# Patient Record
Sex: Female | Born: 1960 | Race: Black or African American | Hispanic: No | Marital: Single | Smoking: Current every day smoker
Health system: Southern US, Community
[De-identification: ages and names within clinical notes are randomized; demographics above are authoritative.]

## PROBLEM LIST (undated history)

## (undated) DIAGNOSIS — J45909 Unspecified asthma, uncomplicated: Secondary | ICD-10-CM

## (undated) DIAGNOSIS — I10 Essential (primary) hypertension: Secondary | ICD-10-CM

## (undated) HISTORY — PX: BACK SURGERY: SHX140

---

## 2012-12-14 ENCOUNTER — Emergency Department (HOSPITAL_COMMUNITY): Payer: Self-pay

## 2012-12-14 ENCOUNTER — Emergency Department (HOSPITAL_COMMUNITY)
Admission: EM | Admit: 2012-12-14 | Discharge: 2012-12-14 | Disposition: A | Discharge status: Home / Self Care | Payer: Self-pay | Attending: Emergency Medicine | Admitting: Emergency Medicine

## 2012-12-14 ENCOUNTER — Encounter (HOSPITAL_COMMUNITY): Payer: Self-pay | Admitting: *Deleted

## 2012-12-14 DIAGNOSIS — I10 Essential (primary) hypertension: Secondary | ICD-10-CM | POA: Insufficient documentation

## 2012-12-14 DIAGNOSIS — J45909 Unspecified asthma, uncomplicated: Secondary | ICD-10-CM

## 2012-12-14 DIAGNOSIS — Z79899 Other long term (current) drug therapy: Secondary | ICD-10-CM | POA: Insufficient documentation

## 2012-12-14 DIAGNOSIS — F17200 Nicotine dependence, unspecified, uncomplicated: Secondary | ICD-10-CM | POA: Insufficient documentation

## 2012-12-14 DIAGNOSIS — J45901 Unspecified asthma with (acute) exacerbation: Secondary | ICD-10-CM | POA: Insufficient documentation

## 2012-12-14 HISTORY — DX: Unspecified asthma, uncomplicated: J45.909

## 2012-12-14 HISTORY — DX: Essential (primary) hypertension: I10

## 2012-12-14 MED ORDER — ALBUTEROL SULFATE (5 MG/ML) 0.5% IN NEBU
5.0000 mg | INHALATION_SOLUTION | Freq: Once | RESPIRATORY_TRACT | Status: AC
  Administered 2012-12-14: 5 mg via RESPIRATORY_TRACT
  Filled 2012-12-14: qty 1

## 2012-12-14 MED ORDER — PREDNISONE 50 MG PO TABS
60.0000 mg | ORAL_TABLET | Freq: Once | ORAL | Status: AC
  Administered 2012-12-14: 60 mg via ORAL
  Filled 2012-12-14: qty 1

## 2012-12-14 MED ORDER — PREDNISONE 10 MG PO TABS: 20.0000 mg | ORAL_TABLET | Freq: Every day | ORAL | Status: DC

## 2012-12-14 MED ORDER — HYDROCODONE-ACETAMINOPHEN 5-325 MG PO TABS
ORAL_TABLET | ORAL | Status: AC
  Administered 2012-12-14: 1
  Filled 2012-12-14: qty 1

## 2012-12-14 MED ORDER — IPRATROPIUM BROMIDE 0.02 % IN SOLN
0.5000 mg | Freq: Once | RESPIRATORY_TRACT | Status: AC
  Administered 2012-12-14: 0.5 mg via RESPIRATORY_TRACT
  Filled 2012-12-14: qty 2.5

## 2012-12-14 MED ORDER — ALBUTEROL SULFATE HFA 108 (90 BASE) MCG/ACT IN AERS
2.0000 | INHALATION_SPRAY | Freq: Once | RESPIRATORY_TRACT | Status: AC
  Administered 2012-12-14: 2 via RESPIRATORY_TRACT
  Filled 2012-12-14: qty 6.7

## 2012-12-14 NOTE — ED Notes (Signed)
Reports non productive cough and difficulty breathing x 2 days.  Speaking clear full sentences in triage.  nad noted.

## 2012-12-14 NOTE — ED Notes (Signed)
Pt reports feeling better after breathing treatment.  Mild wheezes heard in lower lung fields.

## 2012-12-14 NOTE — ED Provider Notes (Signed)
CSN: [id number]     Arrival date & time 12/14/12  1525 History     First MD Initiated Contact with Patient 12/14/12 1741     Chief Complaint  Patient presents with  . Shortness of Breath   (Consider location/radiation/quality/duration/timing/severity/associated sxs/prior Treatment) Patient is a 52 y.o. female presenting with shortness of breath. The history is provided by the patient (the pt complains of sob). No language interpreter was used.  Shortness of Breath Severity:  Moderate Onset quality:  Sudden Timing:  Constant Progression:  Partially resolved Chronicity:  Recurrent Context: activity   Associated symptoms: no abdominal pain, no chest pain, no cough, no headaches and no rash     Past Medical History  Diagnosis Date  . Hypertension   . Asthma    Past Surgical History  Procedure Laterality Date  . Back surgery     No family history on file. History  Substance Use Topics  . Smoking status: Current Every Day Smoker    Types: Cigarettes  . Smokeless tobacco: Not on file  . Alcohol Use: No   OB History   Grav Para Term Preterm Abortions TAB SAB Ect Mult Living                 Review of Systems  Constitutional: Negative for appetite change and fatigue.  HENT: Negative for congestion, sinus pressure and ear discharge.   Eyes: Negative for discharge.  Respiratory: Positive for shortness of breath. Negative for cough.   Cardiovascular: Negative for chest pain.  Gastrointestinal: Negative for abdominal pain and diarrhea.  Genitourinary: Negative for frequency and hematuria.  Musculoskeletal: Negative for back pain.  Skin: Negative for rash.  Neurological: Negative for seizures and headaches.  Psychiatric/Behavioral: Negative for hallucinations.    Allergies  Bactrim; Motrin; and Sulfa antibiotics  Home Medications   Current Outpatient Rx  Name  Route  Sig  Dispense  Refill  . albuterol (PROVENTIL HFA;VENTOLIN HFA) 108 (90 BASE) MCG/ACT inhaler  Inhalation   Inhale 2 puffs into the lungs every 6 (six) hours as needed for wheezing.         . diltiazem (DILACOR XR) 180 MG 24 hr capsule   Oral   Take 180 mg by mouth daily.         Marland Kitchen HYDROcodone-acetaminophen (NORCO/VICODIN) 5-325 MG per tablet   Oral   Take 1 tablet by mouth every 6 (six) hours as needed for pain.         . predniSONE (DELTASONE) 10 MG tablet   Oral   Take 2 tablets (20 mg total) by mouth daily.   6 tablet   0    BP 133/79  Pulse 88  Temp(Src) 97.9 F (36.6 C) (Oral)  Resp 20  Ht 5\' 5"  (1.651 m)  Wt 155 lb (70.308 kg)  BMI 25.79 kg/m2  SpO2 100% Physical Exam  Constitutional: She is oriented to person, place, and time. She appears well-developed.  HENT:  Head: Normocephalic.  Eyes: Conjunctivae and EOM are normal. No scleral icterus.  Neck: Neck supple. No thyromegaly present.  Cardiovascular: Normal rate and regular rhythm.  Exam reveals no gallop and no friction rub.   No murmur heard. Pulmonary/Chest: No stridor. She has wheezes. She has no rales. She exhibits no tenderness.  Abdominal: She exhibits no distension. There is no tenderness. There is no rebound.  Musculoskeletal: Normal range of motion. She exhibits no edema.  Lymphadenopathy:    She has no cervical adenopathy.  Neurological: She is oriented  to person, place, and time. Coordination normal.  Skin: No rash noted. No erythema.  Psychiatric: She has a normal mood and affect. Her behavior is normal.    ED Course   Procedures (including critical care time)  Labs Reviewed - No data to display Dg Chest 2 View  12/14/2012   *RADIOLOGY REPORT*  Clinical Data: Hypertension.  Smoker.  Shortness of breath  CHEST - 2 VIEW  Comparison: None.  Findings: The lung fields are mildly hyperexpanded with changes suggestive of underlying mild COPD.  Increased interstitial markings are noted at both lung bases Bronchitic changes are seen correlating with the history of smoking.  Bilateral apical  pleural thickening, right greater than left is noted.  No pleural fluid or focal infiltrates are seen.  No significant peribronchial cuffing is identified.  Bony structures appear maintained but overall bone density of the thoracic spine appears diminished for patient age.  IMPRESSION: Changes suggestive of mild COPD with bronchitic change.  No worrisome focal abnormality identified.  Decreased bone density of the thoracic spine suggested radiographically. This could be further assessed with DXA exam if desired   Original Report Authenticated By: Rhodia Albright, M.D.   1. Asthma     MDM  Pt improved with tx  Benny Lennert, MD 12/14/12 [phone]

## 2012-12-14 NOTE — ED Notes (Signed)
Pt reports breathing getting worse and needing breathing treatment.  Pt also reports hx of asthma which was not reported during initial triage.  sats wnl, speaking clear full sentences.  No audible wheezing heard upon assessment.  Neb treatment given per pt request.

## 2019-02-13 ENCOUNTER — Emergency Department (HOSPITAL_COMMUNITY): Payer: Medicaid Other

## 2019-02-13 ENCOUNTER — Encounter (HOSPITAL_COMMUNITY): Payer: Self-pay | Admitting: *Deleted

## 2019-02-13 ENCOUNTER — Other Ambulatory Visit: Payer: Self-pay

## 2019-02-13 ENCOUNTER — Emergency Department (HOSPITAL_COMMUNITY)
Admission: EM | Admit: 2019-02-13 | Discharge: 2019-02-13 | Disposition: A | Discharge status: Home / Self Care | Payer: Medicaid Other | Attending: Emergency Medicine | Admitting: Emergency Medicine

## 2019-02-13 DIAGNOSIS — J4541 Moderate persistent asthma with (acute) exacerbation: Secondary | ICD-10-CM | POA: Diagnosis not present

## 2019-02-13 DIAGNOSIS — I10 Essential (primary) hypertension: Secondary | ICD-10-CM | POA: Diagnosis not present

## 2019-02-13 DIAGNOSIS — J069 Acute upper respiratory infection, unspecified: Secondary | ICD-10-CM | POA: Insufficient documentation

## 2019-02-13 DIAGNOSIS — F17210 Nicotine dependence, cigarettes, uncomplicated: Secondary | ICD-10-CM | POA: Insufficient documentation

## 2019-02-13 DIAGNOSIS — R05 Cough: Secondary | ICD-10-CM | POA: Diagnosis present

## 2019-02-13 DIAGNOSIS — Z79899 Other long term (current) drug therapy: Secondary | ICD-10-CM | POA: Insufficient documentation

## 2019-02-13 DIAGNOSIS — Z20828 Contact with and (suspected) exposure to other viral communicable diseases: Secondary | ICD-10-CM | POA: Diagnosis not present

## 2019-02-13 LAB — SARS CORONAVIRUS 2 (TAT 6-24 HRS): SARS Coronavirus 2: NEGATIVE

## 2019-02-13 MED ORDER — OXYMETAZOLINE HCL 0.05 % NA SOLN
1.0000 | Freq: Once | NASAL | Status: AC
  Administered 2019-02-13: 1 via NASAL
  Filled 2019-02-13: qty 30

## 2019-02-13 MED ORDER — HYDROCOD POLST-CPM POLST ER 10-8 MG/5ML PO SUER
5.0000 mL | Freq: Once | ORAL | Status: AC
Start: 2019-02-13 — End: 2019-02-13
  Administered 2019-02-13: 5 mL via ORAL
  Filled 2019-02-13: qty 5

## 2019-02-13 MED ORDER — TRAMADOL HCL 50 MG PO TABS: 50.0000 mg | ORAL_TABLET | Freq: Four times a day (QID) | ORAL | 0 refills | Status: DC | PRN

## 2019-02-13 MED ORDER — ALBUTEROL SULFATE HFA 108 (90 BASE) MCG/ACT IN AERS
4.0000 | INHALATION_SPRAY | Freq: Once | RESPIRATORY_TRACT | Status: AC
  Administered 2019-02-13: 4 via RESPIRATORY_TRACT
  Filled 2019-02-13: qty 6.7

## 2019-02-13 MED ORDER — PREDNISONE 20 MG PO TABS: 40.0000 mg | ORAL_TABLET | Freq: Every day | ORAL | 0 refills | Status: DC

## 2019-02-13 MED ORDER — PREDNISONE 20 MG PO TABS
40.0000 mg | ORAL_TABLET | Freq: Once | ORAL | Status: AC
  Administered 2019-02-13: 40 mg via ORAL
  Filled 2019-02-13: qty 2

## 2019-02-13 MED ORDER — ONDANSETRON HCL 4 MG PO TABS
4.0000 mg | ORAL_TABLET | Freq: Once | ORAL | Status: AC
  Administered 2019-02-13: 4 mg via ORAL
  Filled 2019-02-13: qty 1

## 2019-02-13 MED ORDER — PROMETHAZINE HCL 12.5 MG PO TABS
12.5000 mg | ORAL_TABLET | Freq: Once | ORAL | Status: AC
  Administered 2019-02-13: 16:00:00 12.5 mg via ORAL
  Filled 2019-02-13: qty 1

## 2019-02-13 MED ORDER — TRAMADOL HCL 50 MG PO TABS
100.0000 mg | ORAL_TABLET | Freq: Once | ORAL | Status: AC
  Administered 2019-02-13: 100 mg via ORAL
  Filled 2019-02-13: qty 2

## 2019-02-13 MED ORDER — ALBUTEROL SULFATE (2.5 MG/3ML) 0.083% IN NEBU: 2.5000 mg | INHALATION_SOLUTION | RESPIRATORY_TRACT | 0 refills | Status: DC | PRN

## 2019-02-13 MED ORDER — ACETAMINOPHEN 500 MG PO TABS
1000.0000 mg | ORAL_TABLET | Freq: Once | ORAL | Status: AC
Start: 2019-02-13 — End: 2019-02-13
  Administered 2019-02-13: 15:00:00 1000 mg via ORAL
  Filled 2019-02-13: qty 2

## 2019-02-13 NOTE — Discharge Instructions (Addendum)
Please increase water, juices, and Gatorade's.  Good hydration is extremely important.  Use your mask, wash your hands frequently, and practice adequate distancing. Your chest x-ray is negative for pneumonia or mass or collapsed lung or other emergent changes.  Please use Afrin to each nostril 3 times daily for 5 days only.  Please use albuterol nebulizer every 4 hours as needed for wheezing or difficulty with breathing.  Use Ultram every 6 hours for rib area pain not improved by Tylenol.

## 2019-02-13 NOTE — ED Provider Notes (Signed)
[facility] EMERGENCY DEPARTMENT Provider Note   CSN: [id number] Arrival date & time: 02/13/19  1325     History   Chief Complaint Chief Complaint  Patient presents with  . Cough    HPI Caitlin Brooks is a 58 y.o. female.     Patient is a 58 year old female who presents to the emergency department with a complaint of cough and chest soreness.  Patient has a history of asthma, and hypertension.  Patient states that she uses albuterol nebulizer treatments.  She used her last nebulizer treatment this morning.  Patient states that she currently resides in [place], but is in this area visiting.  Patient states that she has been coughing over the last 2 to 3 days.  She says she has coughed until her ribs are sore and it is sore to take a deep breath.  She denies hemoptysis.  She denies any high fever.  She does not recall being around anyone with the COVID-19 virus.  She has traveled from [facility] to [place].  Nothing makes the cough any better, nothing makes it any worse.  The history is provided by the patient.  Cough Associated symptoms: myalgias   Associated symptoms: no chest pain, no ear pain, no eye discharge, no rash, no rhinorrhea, no shortness of breath and no wheezing     Past Medical History:  Diagnosis Date  . Asthma   . Hypertension     There are no active problems to display for this patient.   Past Surgical History:  Procedure Laterality Date  . BACK SURGERY       OB History   No obstetric history on file.      Home Medications    Prior to Admission medications   Medication Sig Start Date End Date Taking? Authorizing Provider  albuterol (PROVENTIL HFA;VENTOLIN HFA) 108 (90 BASE) MCG/ACT inhaler Inhale 2 puffs into the lungs every 6 (six) hours as needed for wheezing.    [provider]  diltiazem (DILACOR XR) 180 MG 24 hr capsule Take 180 mg by mouth daily.    [provider]  HYDROcodone-acetaminophen  (NORCO/VICODIN) 5-325 MG per tablet Take 1 tablet by mouth every 6 (six) hours as needed for pain.    [provider]  predniSONE (DELTASONE) 10 MG tablet Take 2 tablets (20 mg total) by mouth daily. 12/14/12   Milton Ferguson, MD    Family History No family history on file.  Social History Social History   Tobacco Use  . Smoking status: Current Every Day Smoker    Types: Cigarettes  . Smokeless tobacco: Never Used  . Tobacco comment: 4-5 cigarettes daily  Substance Use Topics  . Alcohol use: No  . Drug use: No     Allergies   Bactrim [sulfamethoxazole-trimethoprim], Motrin [ibuprofen], and Sulfa antibiotics   Review of Systems Review of Systems  Constitutional: Negative for activity change and appetite change.  HENT: Positive for congestion. Negative for ear discharge, ear pain, facial swelling, nosebleeds, rhinorrhea, sneezing and tinnitus.   Eyes: Negative for photophobia, pain and discharge.  Respiratory: Positive for cough. Negative for choking, shortness of breath and wheezing.   Cardiovascular: Negative for chest pain, palpitations and leg swelling.  Gastrointestinal: Negative for abdominal pain, blood in stool, constipation, diarrhea, nausea and vomiting.  Genitourinary: Negative for difficulty urinating, dysuria, flank pain, frequency and hematuria.  Musculoskeletal: Positive for myalgias. Negative for back pain, gait problem and neck pain.  Skin: Negative for color change, rash and  wound.  Neurological: Negative for dizziness, seizures, syncope, facial asymmetry, speech difficulty, weakness and numbness.  Hematological: Negative for adenopathy. Does not bruise/bleed easily.  Psychiatric/Behavioral: Negative for agitation, confusion, hallucinations, self-injury and suicidal ideas. The patient is not nervous/anxious.      Physical Exam Updated Vital Signs BP 121/81 (BP Location: Right Arm)   Pulse 87   Temp 97.6 F (36.4 C) (Oral)   Resp 18   Ht 5\' 5"   (1.651 m)   Wt 59 kg   SpO2 100%   BMI 21.63 kg/m   Physical Exam Vitals signs and nursing note reviewed.  Constitutional:      Appearance: She is well-developed. She is not toxic-appearing.  HENT:     Head: Normocephalic.     Right Ear: Tympanic membrane and external ear normal.     Left Ear: Tympanic membrane and external ear normal.     Nose: Congestion present.  Eyes:     General: Lids are normal.     Pupils: Pupils are equal, round, and reactive to light.  Neck:     Musculoskeletal: Normal range of motion and neck supple.     Vascular: No carotid bruit.  Cardiovascular:     Rate and Rhythm: Normal rate and regular rhythm.     Pulses: Normal pulses.     Heart sounds: Normal heart sounds.  Pulmonary:     Effort: No respiratory distress.     Breath sounds: Normal breath sounds.     Comments: Coarse breath sounds with occasional rhonchi present.  There is symmetrical rise of fall of the chest.  Patient speaks in complete sentences without problem. Abdominal:     General: Bowel sounds are normal.     Palpations: Abdomen is soft.     Tenderness: There is no abdominal tenderness. There is no guarding.  Musculoskeletal: Normal range of motion.  Lymphadenopathy:     Head:     Right side of head: No submandibular adenopathy.     Left side of head: No submandibular adenopathy.     Cervical: No cervical adenopathy.  Skin:    General: Skin is warm and dry.  Neurological:     Mental Status: She is alert and oriented to person, place, and time.     Cranial Nerves: No cranial nerve deficit.     Sensory: No sensory deficit.  Psychiatric:        Speech: Speech normal.      ED Treatments / Results  Labs (all labs ordered are listed, but only abnormal results are displayed) Labs Reviewed - No data to display  EKG None  Radiology No results found.  Procedures Procedures (including critical care time)  Medications Ordered in ED Medications - No data to display    Initial Impression / Assessment and Plan / ED Course  I have reviewed the triage vital signs and the nursing notes.  Pertinent labs & imaging results that were available during my care of the patient were reviewed by me and considered in my medical decision making (see chart for details).          Final Clinical Impressions(s) / ED Diagnoses MDM  Vital signs within normal limits.  Pulse oximetry is 100% on room air.  Within normal limits by my interpretation.  Patient has congestion and cough.  Patient has a history of asthma.  She says she has had to use her nebulizer more than usual over the last 2 to 3 days.  She says she has  coughed so much that she is now sore in her rib areas.  Patient treated in the emergency department with albuterol, Tussionex, and Tylenol.  Recheck.  Patient continues to have violent coughing.  COVID-19 test obtained. Patient treated with Afrin spray, tramadol, and promethazine.  Recheck.  Patient states the pain has improved significantly, and the coughing has improved significantly.  Patient ambulatory in the room, and in the hall with minimal problem.  Patient will be discharged home with an albuterol inhaler.  Prescription for albuterol for nebulizer.  Prescription for 12 tablets of Ultram to be used for pain not improved by Tylenol extra strength.  Patient also given a short course of a steroid medication.  The patient is unsure how long she will be in this area.  I have invited her to return to the emergency department if not improving.  I have asked her to see her physicians in [place] when she returns for recheck and evaluation.   Final diagnoses:  Moderate persistent asthma with exacerbation  Acute upper respiratory infection    ED Discharge Orders         Ordered    predniSONE (DELTASONE) 20 MG tablet  Daily,   Status:  Discontinued     02/13/19 1711    albuterol (PROVENTIL) (2.5 MG/3ML) 0.083% nebulizer solution  Every 4 hours PRN,   Status:   Discontinued     02/13/19 1711    traMADol (ULTRAM) 50 MG tablet  Every 6 hours PRN     02/13/19 1711    predniSONE (DELTASONE) 20 MG tablet  Daily     02/13/19 1717    albuterol (PROVENTIL) (2.5 MG/3ML) 0.083% nebulizer solution  Every 4 hours PRN     02/13/19 1717           Ivery QualeBryant, Chrsitopher Wik, PA-C 02/13/19 1741    Jacalyn LefevreHaviland, Julie, MD 02/14/19 [phone]

## 2019-02-13 NOTE — ED Triage Notes (Signed)
Pt c/o dry cough x 2 days. Unknown fever. Pt reports she lives in [place] and left her nebulizer machine there.

## 2019-02-15 ENCOUNTER — Telehealth: Payer: Self-pay | Admitting: General Practice

## 2019-02-15 NOTE — Telephone Encounter (Signed)
Negative COVID results given. Patient results "NOT Detected." Caller expressed understanding. ° °

## 2019-03-20 ENCOUNTER — Encounter (HOSPITAL_COMMUNITY): Payer: Self-pay

## 2019-03-20 ENCOUNTER — Other Ambulatory Visit: Payer: Self-pay

## 2019-03-20 DIAGNOSIS — R112 Nausea with vomiting, unspecified: Secondary | ICD-10-CM | POA: Diagnosis not present

## 2019-03-20 DIAGNOSIS — M545 Low back pain: Secondary | ICD-10-CM | POA: Diagnosis not present

## 2019-03-20 DIAGNOSIS — I10 Essential (primary) hypertension: Secondary | ICD-10-CM | POA: Insufficient documentation

## 2019-03-20 DIAGNOSIS — F17210 Nicotine dependence, cigarettes, uncomplicated: Secondary | ICD-10-CM | POA: Diagnosis not present

## 2019-03-20 DIAGNOSIS — J45909 Unspecified asthma, uncomplicated: Secondary | ICD-10-CM | POA: Insufficient documentation

## 2019-03-20 DIAGNOSIS — Z79899 Other long term (current) drug therapy: Secondary | ICD-10-CM | POA: Insufficient documentation

## 2019-03-20 NOTE — ED Triage Notes (Signed)
Pt states she vomited x 2 today, no other complaints.

## 2019-03-21 ENCOUNTER — Emergency Department (HOSPITAL_COMMUNITY)
Admission: EM | Admit: 2019-03-21 | Discharge: 2019-03-21 | Disposition: A | Discharge status: Home / Self Care | Payer: Medicaid Other | Attending: Emergency Medicine | Admitting: Emergency Medicine

## 2019-03-21 DIAGNOSIS — M545 Low back pain, unspecified: Secondary | ICD-10-CM

## 2019-03-21 DIAGNOSIS — R112 Nausea with vomiting, unspecified: Secondary | ICD-10-CM

## 2019-03-21 LAB — COMPREHENSIVE METABOLIC PANEL
ALT: 15 U/L (ref 0–44)
AST: 20 U/L (ref 15–41)
Albumin: 4.4 g/dL (ref 3.5–5.0)
Alkaline Phosphatase: 60 U/L (ref 38–126)
Anion gap: 10 (ref 5–15)
BUN: 35 mg/dL — ABNORMAL HIGH (ref 6–20)
CO2: 23 mmol/L (ref 22–32)
Calcium: 9.2 mg/dL (ref 8.9–10.3)
Chloride: 106 mmol/L (ref 98–111)
Creatinine, Ser: 1.2 mg/dL — ABNORMAL HIGH (ref 0.44–1.00)
GFR calc Af Amer: 57 mL/min — ABNORMAL LOW (ref >60)
GFR calc non Af Amer: 49 mL/min — ABNORMAL LOW (ref >60)
Glucose, Bld: 96 mg/dL (ref 70–99)
Potassium: 4.1 mmol/L (ref 3.5–5.1)
Sodium: 139 mmol/L (ref 135–145)
Total Bilirubin: 0.4 mg/dL (ref 0.3–1.2)
Total Protein: 7.4 g/dL (ref 6.5–8.1)

## 2019-03-21 LAB — RAPID URINE DRUG SCREEN, HOSP PERFORMED
Amphetamines: NOT DETECTED
Barbiturates: NOT DETECTED
Benzodiazepines: NOT DETECTED
Cocaine: NOT DETECTED
Opiates: POSITIVE — AB
Tetrahydrocannabinol: NOT DETECTED

## 2019-03-21 LAB — URINALYSIS, ROUTINE W REFLEX MICROSCOPIC
Bilirubin Urine: NEGATIVE
Glucose, UA: NEGATIVE mg/dL
Hgb urine dipstick: NEGATIVE
Ketones, ur: NEGATIVE mg/dL
Leukocytes,Ua: NEGATIVE
Nitrite: NEGATIVE
Protein, ur: NEGATIVE mg/dL
Specific Gravity, Urine: 1.021 (ref 1.005–1.030)
pH: 5 (ref 5.0–8.0)

## 2019-03-21 LAB — CBC WITH DIFFERENTIAL/PLATELET
Abs Immature Granulocytes: 0.02 K/uL (ref 0.00–0.07)
Basophils Absolute: 0.1 K/uL (ref 0.0–0.1)
Basophils Relative: 1 %
Eosinophils Absolute: 0.3 K/uL (ref 0.0–0.5)
Eosinophils Relative: 3 %
HCT: 40 % (ref 36.0–46.0)
Hemoglobin: 12.8 g/dL (ref 12.0–15.0)
Immature Granulocytes: 0 %
Lymphocytes Relative: 41 %
Lymphs Abs: 3.8 K/uL (ref 0.7–4.0)
MCH: 31.5 pg (ref 26.0–34.0)
MCHC: 32 g/dL (ref 30.0–36.0)
MCV: 98.5 fL (ref 80.0–100.0)
Monocytes Absolute: 0.9 K/uL (ref 0.1–1.0)
Monocytes Relative: 10 %
Neutro Abs: 4.3 K/uL (ref 1.7–7.7)
Neutrophils Relative %: 45 %
Platelets: 333 K/uL (ref 150–400)
RBC: 4.06 MIL/uL (ref 3.87–5.11)
RDW: 14 % (ref 11.5–15.5)
WBC: 9.4 K/uL (ref 4.0–10.5)
nRBC: 0 % (ref 0.0–0.2)

## 2019-03-21 LAB — LIPASE, BLOOD: Lipase: 51 U/L (ref 11–51)

## 2019-03-21 MED ORDER — SODIUM CHLORIDE 0.9 % IV BOLUS (SEPSIS)
1000.0000 mL | Freq: Once | INTRAVENOUS | Status: AC
  Administered 2019-03-21: 1000 mL via INTRAVENOUS

## 2019-03-21 MED ORDER — ONDANSETRON HCL 4 MG/2ML IJ SOLN
4.0000 mg | Freq: Once | INTRAMUSCULAR | Status: AC
  Administered 2019-03-21: 4 mg via INTRAVENOUS
  Filled 2019-03-21: qty 2

## 2019-03-21 MED ORDER — HYDROCODONE-ACETAMINOPHEN 5-325 MG PO TABS
1.0000 | ORAL_TABLET | Freq: Once | ORAL | Status: AC
  Administered 2019-03-21: 1 via ORAL
  Filled 2019-03-21: qty 1

## 2019-03-21 NOTE — ED Provider Notes (Signed)
[facility] EMERGENCY DEPARTMENT Provider Note   CSN: [id number] Arrival date & time: 03/20/19  2245     History   Chief Complaint Chief Complaint  Patient presents with  . Emesis    HPI Caitlin Brooks is a 58 y.o. female.     The history is provided by the patient.  Emesis Severity:  Moderate Progression:  Unchanged Chronicity:  New Relieved by:  Nothing Worsened by:  Nothing Associated symptoms: no abdominal pain, no diarrhea and no fever   Risk factors: no alcohol use   Patient presents with multiple complaints.  Patient has a history of asthma/hypertension.  She reports she had 2 episodes of vomiting prior to arrival.  She also reports dizziness.  She reports low blood pressure.  No diarrhea.  She also reports feeling very anxious. No new medicines.  No drug abuse.  No alcohol use Past Medical History:  Diagnosis Date  . Asthma   . Hypertension     There are no active problems to display for this patient.   Past Surgical History:  Procedure Laterality Date  . BACK SURGERY       OB History   No obstetric history on file.      Home Medications    Prior to Admission medications   Medication Sig Start Date End Date Taking? Authorizing Provider  albuterol (PROVENTIL) (2.5 MG/3ML) 0.083% nebulizer solution Take 3 mLs (2.5 mg total) by nebulization every 4 (four) hours as needed for wheezing or shortness of breath. 02/13/19   Lily Kocher, PA-C  diltiazem (DILACOR XR) 180 MG 24 hr capsule Take 180 mg by mouth daily.    [provider]  HYDROcodone-acetaminophen (NORCO/VICODIN) 5-325 MG per tablet Take 1 tablet by mouth every 6 (six) hours as needed for pain.    [provider]  predniSONE (DELTASONE) 20 MG tablet Take 2 tablets (40 mg total) by mouth daily. 02/13/19   Lily Kocher, PA-C  traMADol (ULTRAM) 50 MG tablet Take 1 tablet (50 mg total) by mouth every 6 (six) hours as needed. 02/13/19   Lily Kocher, PA-C    Family History  No family history on file.  Social History Social History   Tobacco Use  . Smoking status: Current Every Day Smoker    Types: Cigarettes  . Smokeless tobacco: Never Used  . Tobacco comment: 4-5 cigarettes daily  Substance Use Topics  . Alcohol use: No  . Drug use: No     Allergies   Bactrim [sulfamethoxazole-trimethoprim], Motrin [ibuprofen], and Sulfa antibiotics   Review of Systems Review of Systems  Constitutional: Positive for fatigue. Negative for fever.  Cardiovascular: Negative for chest pain.  Gastrointestinal: Positive for vomiting. Negative for abdominal pain and diarrhea.  Neurological: Positive for dizziness.  Psychiatric/Behavioral: The patient is nervous/anxious.   All other systems reviewed and are negative.    Physical Exam Updated Vital Signs BP 102/71 (BP Location: Right Arm)   Pulse 68   Temp 97.7 F (36.5 C) (Oral)   Resp 16   Wt 68 kg   SpO2 98%   BMI 24.96 kg/m   Physical Exam CONSTITUTIONAL: Well developed/well nourished, anxious HEAD: Normocephalic/atraumatic EYES: EOMI/PERRL, no nystagmus, no ptosis ENMT: Mucous membranes moist NECK: supple no meningeal signs  SPINE/BACK:entire spine nontender CV: S1/S2 noted, no murmurs/rubs/gallops noted LUNGS: Lungs are clear to auscultation bilaterally, no apparent distress ABDOMEN: soft, nontender, no rebound or guarding GU:no cva tenderness NEURO:Awake/alert, face symmetric, no arm or leg drift is noted Equal 5/5 strength  with shoulder abduction, elbow flex/extension, wrist flex/extension in upper extremities and equal hand grips bilaterally Cranial nerves 3/4/5/6/10/31/08/11/12 tested and intact Sensation to light touch intact in all extremities Patient appears restless on exam EXTREMITIES: pulses normal, full ROM SKIN: warm, color normal PSYCH: anxious   ED Treatments / Results  Labs (all labs ordered are listed, but only abnormal results are displayed) Labs Reviewed  COMPREHENSIVE  METABOLIC PANEL - Abnormal; Notable for the following components:      Result Value   BUN 35 (*)    Creatinine, Ser 1.20 (*)    GFR calc non Af Amer 49 (*)    GFR calc Af Amer 57 (*)    All other components within normal limits  RAPID URINE DRUG SCREEN, HOSP PERFORMED - Abnormal; Notable for the following components:   Opiates POSITIVE (*)    All other components within normal limits  CBC WITH DIFFERENTIAL/PLATELET  LIPASE, BLOOD  URINALYSIS, ROUTINE W REFLEX MICROSCOPIC    EKG EKG Interpretation  Date/Time:  Thursday March 21 2019 05:06:40 EST Ventricular Rate:  76 PR Interval:    QRS Duration: 94 QT Interval:  412 QTC Calculation: 464 R Axis:   72 Text Interpretation: Sinus rhythm Borderline T abnormalities, anterior leads Confirmed by Zadie Rhine ([id number]) on 03/21/2019 6:16:29 AM   Radiology No results found.  Procedures Procedures    Medications Ordered in ED Medications  sodium chloride 0.9 % bolus 1,000 mL (1,000 mLs Intravenous New Bag/Given 03/21/19 0511)  ondansetron (ZOFRAN) injection 4 mg (4 mg Intravenous Given 03/21/19 0511)  HYDROcodone-acetaminophen (NORCO/VICODIN) 5-325 MG per tablet 1 tablet (1 tablet Oral Given 03/21/19 0729)     Initial Impression / Assessment and Plan / ED Course  I have reviewed the triage vital signs and the nursing notes.  Pertinent labs & imaging results that were available during my care of the patient were reviewed by me and considered in my medical decision making (see chart for details).        5:25 AM Patient presents for multiple complaints including vomiting, fatigue and dizziness.  Patient appears anxious and very restless in the bed during the exam. Labs are pending at this time. 7:40 AM Patient improved.  Labs reassuring.  She ambulated well.  She reports some back pain which can be chronic for her. No new complaints. She now reports she takes Norco occasionally for back pain,  Also has  meds for  anxiety.  She reports this may explain why she feels anxious and she is twitching  Overall patient is well-appearing and appropriate for discharge home.  Patient reports she is from out of town Final Clinical Impressions(s) / ED Diagnoses   Final diagnoses:  Non-intractable vomiting with nausea, unspecified vomiting type  Acute midline low back pain without sciatica    ED Discharge Orders    None       Zadie Rhine, MD 03/21/19 [phone]

## 2019-03-21 NOTE — ED Notes (Signed)
Pt ambulated well to the bathroom by herself.

## 2019-03-21 NOTE — ED Notes (Signed)
Pt was  given warm blankets.

## 2019-05-01 ENCOUNTER — Emergency Department (HOSPITAL_COMMUNITY)
Admission: EM | Admit: 2019-05-01 | Discharge: 2019-05-01 | Disposition: A | Discharge status: Home / Self Care | Payer: Medicaid Other | Attending: Emergency Medicine | Admitting: Emergency Medicine

## 2019-05-01 ENCOUNTER — Other Ambulatory Visit: Payer: Self-pay

## 2019-05-01 ENCOUNTER — Encounter (HOSPITAL_COMMUNITY): Payer: Self-pay | Admitting: Emergency Medicine

## 2019-05-01 DIAGNOSIS — R0602 Shortness of breath: Secondary | ICD-10-CM | POA: Diagnosis present

## 2019-05-01 DIAGNOSIS — J45909 Unspecified asthma, uncomplicated: Secondary | ICD-10-CM | POA: Insufficient documentation

## 2019-05-01 DIAGNOSIS — M7918 Myalgia, other site: Secondary | ICD-10-CM | POA: Insufficient documentation

## 2019-05-01 DIAGNOSIS — Z20822 Contact with and (suspected) exposure to covid-19: Secondary | ICD-10-CM | POA: Insufficient documentation

## 2019-05-01 DIAGNOSIS — K0889 Other specified disorders of teeth and supporting structures: Secondary | ICD-10-CM | POA: Insufficient documentation

## 2019-05-01 DIAGNOSIS — B349 Viral infection, unspecified: Secondary | ICD-10-CM | POA: Diagnosis not present

## 2019-05-01 DIAGNOSIS — Z79899 Other long term (current) drug therapy: Secondary | ICD-10-CM | POA: Diagnosis not present

## 2019-05-01 DIAGNOSIS — R0789 Other chest pain: Secondary | ICD-10-CM | POA: Diagnosis not present

## 2019-05-01 DIAGNOSIS — F17210 Nicotine dependence, cigarettes, uncomplicated: Secondary | ICD-10-CM | POA: Insufficient documentation

## 2019-05-01 DIAGNOSIS — I10 Essential (primary) hypertension: Secondary | ICD-10-CM | POA: Insufficient documentation

## 2019-05-01 LAB — POC SARS CORONAVIRUS 2 AG -  ED: SARS Coronavirus 2 Ag: NEGATIVE

## 2019-05-01 MED ORDER — TRAMADOL HCL 50 MG PO TABS: 50.0000 mg | ORAL_TABLET | Freq: Four times a day (QID) | ORAL | 0 refills | Status: AC | PRN

## 2019-05-01 MED ORDER — ALBUTEROL SULFATE HFA 108 (90 BASE) MCG/ACT IN AERS
2.0000 | INHALATION_SPRAY | Freq: Once | RESPIRATORY_TRACT | Status: AC
  Administered 2019-05-01: 2 via RESPIRATORY_TRACT
  Filled 2019-05-01: qty 6.7

## 2019-05-01 MED ORDER — AMOXICILLIN 250 MG PO CAPS
500.0000 mg | ORAL_CAPSULE | Freq: Once | ORAL | Status: AC
  Administered 2019-05-01: 500 mg via ORAL
  Filled 2019-05-01: qty 2

## 2019-05-01 MED ORDER — AMOXICILLIN 500 MG PO CAPS: 500.0000 mg | ORAL_CAPSULE | Freq: Three times a day (TID) | ORAL | 0 refills | Status: AC

## 2019-05-01 MED ORDER — HYDROCODONE-ACETAMINOPHEN 5-325 MG PO TABS
1.0000 | ORAL_TABLET | Freq: Once | ORAL | Status: AC
  Administered 2019-05-01: 21:00:00 1 via ORAL
  Filled 2019-05-01: qty 1

## 2019-05-01 NOTE — Discharge Instructions (Addendum)
Your Covid test this evening is pending.  Your results should be back in 24 to 48 hours.  You may review your results on MyChart if you have this app installed on your phone.  Please quarantine at home until your results are back.  You may use the albuterol inhaler 1 to 2 puffs every 4-6 hours as needed.  Tylenol every 4 hours if needed for body aches or fever.  Take the antibiotic as directed for your dental pain and be sure to follow-up with your dentist when you return home.

## 2019-05-01 NOTE — ED Provider Notes (Signed)
[facility] EMERGENCY DEPARTMENT Provider Note   CSN: [id number] Arrival date & time: 05/01/19  1658     History Chief Complaint  Patient presents with  . Generalized Body Aches  . Shortness of Breath    ALIYA Brooks is a 59 y.o. female.  HPI      Caitlin Brooks is a 59 y.o. female with past medical history of asthma and hypertension. she presents to the Emergency Department complaining of shortness of breath, wheezing, and generalized body aches.  Symptoms began 3 days ago.  She states she is here visiting from [place] taking care of her uncle.  She denies any recent Covid exposures.  She states that she usually uses an albuterol inhaler, but did not bring her inhaler with her.  She describes feeling tightness in her chest associated with cough and wheezing.  Body aches were of sudden onset.  She denies loss of taste or smell, diarrhea, nausea or vomiting, and chest pain.  She also complains of left upper tooth pain for several days.  She is recently noticed that one of her teeth has chipped off.  She describes a sharp stabbing type pain in her upper tooth that radiates into her face and toward her ear.  Pain worse with chewing. She denies any facial swelling, neck pain, difficulty swallowing or breathing.     Past Medical History:  Diagnosis Date  . Asthma   . Hypertension     There are no problems to display for this patient.   Past Surgical History:  Procedure Laterality Date  . BACK SURGERY       OB History   No obstetric history on file.     No family history on file.  Social History   Tobacco Use  . Smoking status: Current Every Day Smoker    Types: Cigarettes  . Smokeless tobacco: Never Used  . Tobacco comment: 4-5 cigarettes daily  Substance Use Topics  . Alcohol use: No  . Drug use: No    Home Medications Prior to Admission medications   Medication Sig Start Date End Date Taking? Authorizing Provider  albuterol (PROVENTIL) (2.5 MG/3ML)  0.083% nebulizer solution Take 3 mLs (2.5 mg total) by nebulization every 4 (four) hours as needed for wheezing or shortness of breath. 02/13/19  Yes Lily Kocher, PA-C  amLODipine (NORVASC) 10 MG tablet Take 10 mg by mouth daily. 03/12/19  Yes [provider]  diltiazem (DILACOR XR) 180 MG 24 hr capsule Take 180 mg by mouth daily.   Yes [provider]  gabapentin (NEURONTIN) 300 MG capsule Take 300 mg by mouth 3 (three) times daily. 02/07/19  Yes [provider]  HYDROcodone-acetaminophen (NORCO) 7.5-325 MG tablet Take 1 tablet by mouth 3 (three) times daily. 02/07/19  Yes [provider]  lisinopril (ZESTRIL) 10 MG tablet Take 10 mg by mouth daily. 11/07/18  Yes [provider]  meloxicam (MOBIC) 15 MG tablet Take 15 mg by mouth daily. 03/12/19  Yes [provider]  PROAIR HFA 108 (90 Base) MCG/ACT inhaler Inhale 2 puffs into the lungs 2 (two) times daily.  03/12/19  Yes [provider]  sertraline (ZOLOFT) 50 MG tablet Take 50 mg by mouth daily. 03/12/19  Yes [provider]  zolpidem (AMBIEN) 5 MG tablet Take 5 mg by mouth at bedtime.  02/07/19  Yes [provider]  predniSONE (DELTASONE) 20 MG tablet Take 2 tablets (40 mg total) by mouth daily. Patient not taking: Reported on 05/01/2019  02/13/19   Ivery Quale, PA-C  traMADol (ULTRAM) 50 MG tablet Take 1 tablet (50 mg total) by mouth every 6 (six) hours as needed. Patient not taking: Reported on 05/01/2019 02/13/19   Ivery Quale, PA-C    Allergies    Bactrim [sulfamethoxazole-trimethoprim], Motrin [ibuprofen], and Sulfa antibiotics  Review of Systems   Review of Systems  Constitutional: Negative for appetite change and fever.  HENT: Positive for dental problem. Negative for congestion, facial swelling, rhinorrhea, sinus pressure, sore throat and trouble swallowing.   Eyes: Negative for pain and visual disturbance.  Respiratory: Positive for chest  tightness, shortness of breath and wheezing.   Cardiovascular: Positive for chest pain.  Gastrointestinal: Negative for abdominal pain, nausea and vomiting.  Genitourinary: Negative for dysuria and flank pain.  Musculoskeletal: Positive for myalgias. Negative for neck pain and neck stiffness.  Skin: Negative for rash and wound.  Neurological: Negative for dizziness, facial asymmetry, weakness, numbness and headaches.  Hematological: Negative for adenopathy.    Physical Exam Updated Vital Signs BP (!) 142/102 (BP Location: Right Arm)   Pulse 84   Temp 97.8 F (36.6 C) (Oral)   Resp 16   Ht 5' 5.5" (1.664 m)   Wt 70.3 kg   SpO2 99%   BMI 25.40 kg/m   Physical Exam Vitals and nursing note reviewed.  Constitutional:      General: She is not in acute distress.    Appearance: She is well-developed. She is not ill-appearing or toxic-appearing.  HENT:     Mouth/Throat:     Mouth: Mucous membranes are moist. No oral lesions.     Dentition: Dental tenderness and dental caries present. No gingival swelling or dental abscesses.     Pharynx: Oropharynx is clear. Uvula midline. No pharyngeal swelling, oropharyngeal exudate, posterior oropharyngeal erythema or uvula swelling.     Comments: ttp of the left upper first molar. Dental caries present and tooth is partially avulsed.  No edema or erythema of the surrounding gingiva.  No trismus Cardiovascular:     Rate and Rhythm: Normal rate and regular rhythm.     Pulses: Normal pulses.  Pulmonary:     Effort: Pulmonary effort is normal.     Breath sounds: Rhonchi present.  Abdominal:     Palpations: Abdomen is soft. There is no mass.     Tenderness: There is no abdominal tenderness.  Musculoskeletal:     Cervical back: Normal range of motion.     Right lower leg: No edema.     Left lower leg: No edema.  Skin:    General: Skin is warm.     Capillary Refill: Capillary refill takes less than 2 seconds.  Neurological:     General: No  focal deficit present.     Mental Status: She is alert.     Sensory: No sensory deficit.     Motor: No weakness.     ED Results / Procedures / Treatments   Labs (all labs ordered are listed, but only abnormal results are displayed) Labs Reviewed  NOVEL CORONAVIRUS, NAA (HOSP ORDER, SEND-OUT TO REF LAB; TAT 18-24 HRS)  POC SARS CORONAVIRUS 2 AG -  ED    EKG None  Radiology No results found.  Procedures Procedures (including critical care time)  Medications Ordered in ED Medications  albuterol (VENTOLIN HFA) 108 (90 Base) MCG/ACT inhaler 2 puff (has no administration in time range)  HYDROcodone-acetaminophen (NORCO/VICODIN) 5-325 MG per tablet 1 tablet (has no administration in time range)  amoxicillin (AMOXIL) capsule 500 mg (has no administration in time range)    ED Course  I have reviewed the triage vital signs and the nursing notes.  Pertinent labs & imaging results that were available during my care of the patient were reviewed by me and considered in my medical decision making (see chart for details).    MDM Rules/Calculators/A&P                      Pt here with dental pain and symptoms suggestive of COVID.  No hypoxia, tachycardia or tachypnea.  Hx of asthma, here from [place] and out of her albuterol.  She is well appearing, non-toxic. No tachycardic, hypoxia or tachypnea.  Will dispense albuterol inhaler.  She appears appropriate for d/c home.  Discussed home isolation guidelines at least until results are back, return precautions discussed.   WALLIS SPIZZIRRI was evaluated in Emergency Department on 05/03/2019 for the symptoms described in the history of present illness. She was evaluated in the context of the global COVID-19 pandemic, which necessitated consideration that the patient might be at risk for infection with the SARS-CoV-2 virus that causes COVID-19. Institutional protocols and algorithms that pertain to the evaluation of patients at risk for COVID-19  are in a state of rapid change based on information released by regulatory bodies including the CDC and federal and state organizations. These policies and algorithms were followed during the patient's care in the ED.    Final Clinical Impression(s) / ED Diagnoses Final diagnoses:  Pain, dental  Viral syndrome    Rx / DC Orders ED Discharge Orders    None       Rosey Bath 05/03/19 Windell Moment    Mancel Bale, MD 05/06/19 [phone]

## 2019-05-03 ENCOUNTER — Telehealth: Payer: Self-pay

## 2019-05-03 LAB — NOVEL CORONAVIRUS, NAA (HOSP ORDER, SEND-OUT TO REF LAB; TAT 18-24 HRS): SARS-CoV-2, NAA: NOT DETECTED

## 2019-05-03 NOTE — Telephone Encounter (Signed)
Pt notified of negative COVID-19 results. Understanding verbalized.  Chasta M Hopkins   

## 2021-03-07 IMAGING — CR DG CHEST 1V PORT
1 series · 1 of 1 positions shown · non-contrast
Comparison: 12/14/2012

CLINICAL DATA: Cough for 2 days, rib soreness.

EXAM:
PORTABLE CHEST 1 VIEW

[portable]
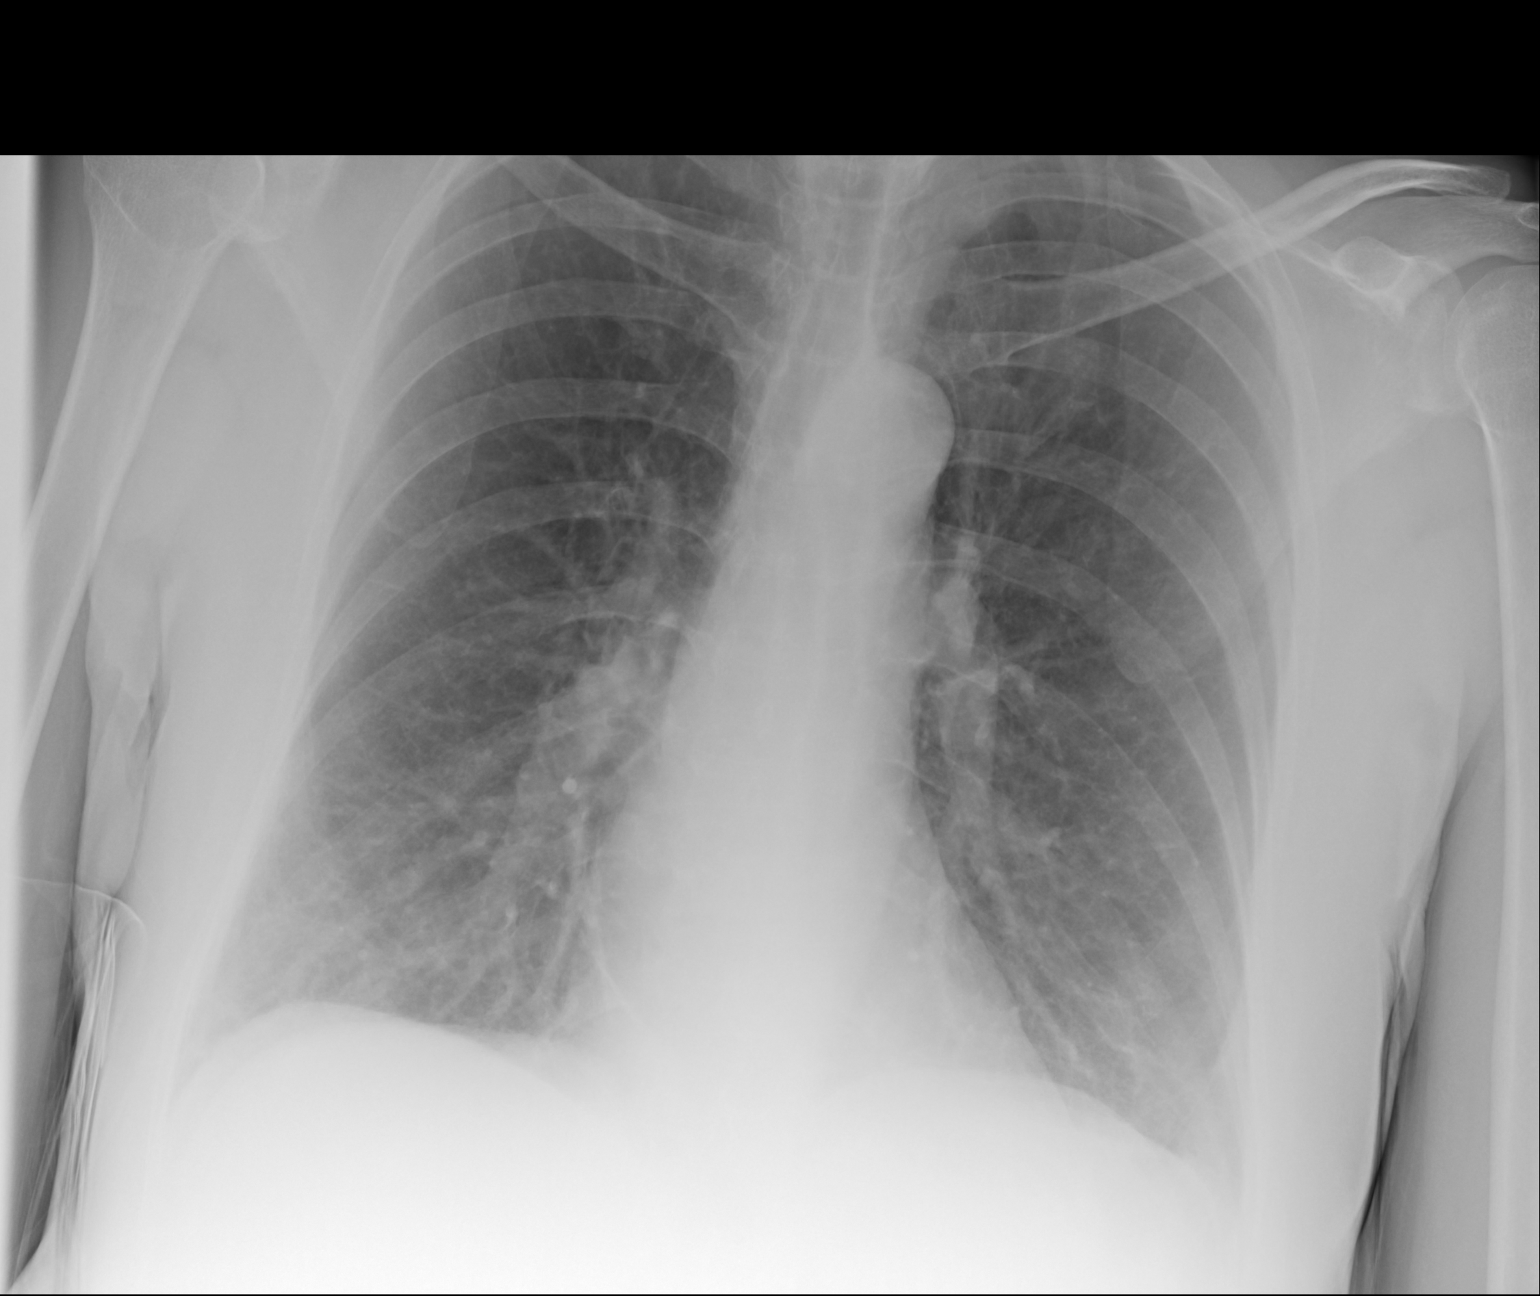

[1 of 1 positions shown; findings below may reference images not displayed]

FINDINGS: Cardiomediastinal contours are stable. Stable areas of scarring or
atelectasis at the lung bases. No signs of consolidation or pleural
effusion.

No acute bone finding.
IMPRESSION: No active disease.

## 2023-08-16 ENCOUNTER — Emergency Department (HOSPITAL_COMMUNITY)

## 2023-08-16 ENCOUNTER — Other Ambulatory Visit: Payer: Self-pay

## 2023-08-16 ENCOUNTER — Emergency Department (HOSPITAL_COMMUNITY)
Admission: EM | Admit: 2023-08-16 | Discharge: 2023-08-16 | Disposition: A | Discharge status: Home / Self Care | Attending: Emergency Medicine | Admitting: Emergency Medicine

## 2023-08-16 DIAGNOSIS — R059 Cough, unspecified: Secondary | ICD-10-CM | POA: Diagnosis present

## 2023-08-16 DIAGNOSIS — J441 Chronic obstructive pulmonary disease with (acute) exacerbation: Secondary | ICD-10-CM | POA: Diagnosis not present

## 2023-08-16 LAB — CBC
HCT: 38 % (ref 36.0–46.0)
Hemoglobin: 12.4 g/dL (ref 12.0–15.0)
MCH: 31.5 pg (ref 26.0–34.0)
MCHC: 32.6 g/dL (ref 30.0–36.0)
MCV: 96.4 fL (ref 80.0–100.0)
Platelets: 269 10*3/uL (ref 150–400)
RBC: 3.94 MIL/uL (ref 3.87–5.11)
RDW: 13.1 % (ref 11.5–15.5)
WBC: 8.4 10*3/uL (ref 4.0–10.5)
nRBC: 0 % (ref 0.0–0.2)

## 2023-08-16 LAB — BASIC METABOLIC PANEL WITH GFR
Anion gap: 8 (ref 5–15)
BUN: 18 mg/dL (ref 8–23)
CO2: 25 mmol/L (ref 22–32)
Calcium: 8.7 mg/dL — ABNORMAL LOW (ref 8.9–10.3)
Chloride: 107 mmol/L (ref 98–111)
Creatinine, Ser: 1 mg/dL (ref 0.44–1.00)
GFR, Estimated: >60 mL/min (ref >60)
Glucose, Bld: 95 mg/dL (ref 70–99)
Potassium: 4.5 mmol/L (ref 3.5–5.1)
Sodium: 140 mmol/L (ref 135–145)

## 2023-08-16 LAB — RESP PANEL BY RT-PCR (RSV, FLU A&B, COVID)  RVPGX2
Influenza A by PCR: NEGATIVE
Influenza B by PCR: NEGATIVE
Resp Syncytial Virus by PCR: NEGATIVE
SARS Coronavirus 2 by RT PCR: NEGATIVE

## 2023-08-16 MED ORDER — PREDNISONE 50 MG PO TABS: ORAL_TABLET | ORAL | 0 refills | Status: AC

## 2023-08-16 MED ORDER — LIDOCAINE 5 % EX PTCH
1.0000 | MEDICATED_PATCH | CUTANEOUS | Status: DC
  Administered 2023-08-16: 1 via TRANSDERMAL
  Filled 2023-08-16: qty 1

## 2023-08-16 MED ORDER — IPRATROPIUM-ALBUTEROL 0.5-2.5 (3) MG/3ML IN SOLN
3.0000 mL | Freq: Once | RESPIRATORY_TRACT | Status: AC
  Administered 2023-08-16: 3 mL via RESPIRATORY_TRACT
  Filled 2023-08-16: qty 3

## 2023-08-16 MED ORDER — METHYLPREDNISOLONE SODIUM SUCC 125 MG IJ SOLR
125.0000 mg | Freq: Once | INTRAMUSCULAR | Status: AC
  Administered 2023-08-16: 125 mg via INTRAVENOUS
  Filled 2023-08-16: qty 2

## 2023-08-16 NOTE — ED Notes (Signed)
 Pt understood d/c instructions and when to return to the ED. IV was d/c and VS retaken. Medication was taught and understood. Pt left by herself to go home

## 2023-08-16 NOTE — ED Triage Notes (Signed)
 Pt report SOB with dry nonproductive cough that started yesterday. Denies fevers/chills. No known sick exposure. Hx COPD, Asthma.

## 2023-08-16 NOTE — ED Provider Notes (Signed)
 [place] EMERGENCY DEPARTMENT AT [facility] Provider Note   CSN: [id number] Arrival date & time: 08/16/23  0544     History  Chief Complaint  Patient presents with   Shortness of Breath    Caitlin Brooks is a 63 y.o. female.  Patient complains of shortness of breath.  Patient reports she has a history of COPD.  Patient feels like she is having a flareup of her COPD.  Patient reports she began coughing yesterday.  Patient states she feels like she needs a breathing treatment.  Patient has been on prednisone  in the past for the same.  Patient denies any chest pain.  The history is provided by the patient. No language interpreter was used.  Shortness of Breath Severity:  Moderate Onset quality:  Gradual Duration:  2 days Timing:  Constant Progression:  Improving Chronicity:  New Relieved by:  Nothing Worsened by:  Nothing      Home Medications Prior to Admission medications   Medication Sig Start Date End Date Taking? Authorizing Provider  albuterol  (PROVENTIL ) (2.5 MG/3ML) 0.083% nebulizer solution Take 3 mLs (2.5 mg total) by nebulization every 4 (four) hours as needed for wheezing or shortness of breath. 02/13/19   Venson Ginger, PA-C  amLODipine (NORVASC) 10 MG tablet Take 10 mg by mouth daily. 03/12/19   [provider]  amoxicillin  (AMOXIL ) 500 MG capsule Take 1 capsule (500 mg total) by mouth 3 (three) times daily. 05/01/19   Triplett, Tammy, PA-C  diltiazem (DILACOR XR) 180 MG 24 hr capsule Take 180 mg by mouth daily.    [provider]  gabapentin (NEURONTIN) 300 MG capsule Take 300 mg by mouth 3 (three) times daily. 02/07/19   [provider]  HYDROcodone -acetaminophen  (NORCO) 7.5-325 MG tablet Take 1 tablet by mouth 3 (three) times daily. 02/07/19   [provider]  lisinopril (ZESTRIL) 10 MG tablet Take 10 mg by mouth daily. 11/07/18   [provider]  meloxicam (MOBIC) 15 MG tablet Take 15 mg by mouth  daily. 03/12/19   [provider]  predniSONE  (DELTASONE ) 20 MG tablet Take 2 tablets (40 mg total) by mouth daily. Patient not taking: Reported on 05/01/2019 02/13/19   Venson Ginger, PA-C  PROAIR  HFA 108 (90 Base) MCG/ACT inhaler Inhale 2 puffs into the lungs 2 (two) times daily.  03/12/19   [provider]  sertraline (ZOLOFT) 50 MG tablet Take 50 mg by mouth daily. 03/12/19   [provider]  traMADol  (ULTRAM ) 50 MG tablet Take 1 tablet (50 mg total) by mouth every 6 (six) hours as needed. 05/01/19   Triplett, Tammy, PA-C  zolpidem (AMBIEN) 5 MG tablet Take 5 mg by mouth at bedtime.  02/07/19   [provider]      Allergies    Bactrim [sulfamethoxazole-trimethoprim], Motrin [ibuprofen], and Sulfa antibiotics    Review of Systems   Review of Systems  Respiratory:  Positive for shortness of breath.   All other systems reviewed and are negative.   Physical Exam Updated Vital Signs BP 135/71   Pulse (!) 59   Temp 97.9 F (36.6 C)   Resp 15   Ht 5\' 5"  (1.651 m)   Wt 49.9 kg   SpO2 99%   BMI 18.30 kg/m  Physical Exam Vitals and nursing note reviewed.  Constitutional:      Appearance: She is well-developed.  HENT:     Head: Normocephalic.  Cardiovascular:     Rate and Rhythm: Normal rate.  Pulmonary:     Effort: Pulmonary effort is normal.     Breath sounds: Decreased breath sounds and rhonchi present.  Abdominal:     General: There is no distension.  Musculoskeletal:        General: Normal range of motion.     Cervical back: Normal range of motion.  Skin:    General: Skin is warm.  Neurological:     General: No focal deficit present.     Mental Status: She is alert and oriented to person, place, and time.  Psychiatric:        Mood and Affect: Mood normal.     ED Results / Procedures / Treatments   Labs (all labs ordered are listed, but only abnormal results are displayed) Labs Reviewed  BASIC METABOLIC PANEL WITH GFR -  Abnormal; Notable for the following components:      Result Value   Calcium 8.7 (*)    All other components within normal limits  RESP PANEL BY RT-PCR (RSV, FLU A&B, COVID)  [id number]  CBC    EKG None  Radiology DG Chest 2 View Result Date: 08/16/2023 CLINICAL DATA:  Shortness of breath. EXAM: CHEST - 2 VIEW COMPARISON:  02/13/2019 FINDINGS: Heart size and mediastinal contours are normal. Lungs are hyperinflated with coarsened interstitial markings of COPD/emphysema. No pleural effusion, interstitial edema or consolidative change. Biapical pleuroparenchymal scarring. IMPRESSION: 1. No acute cardiopulmonary disease. 2. COPD/emphysema. Electronically Signed   By: Kimberley Penman M.D.   On: 08/16/2023 06:34    Procedures Procedures    Medications Ordered in ED Medications  lidocaine  (LIDODERM ) 5 % 1 patch (1 patch Transdermal Patch Applied 08/16/23 1330)  ipratropium-albuterol  (DUONEB) 0.5-2.5 (3) MG/3ML nebulizer solution 3 mL (3 mLs Nebulization Given 08/16/23 1202)  methylPREDNISolone  sodium succinate (SOLU-MEDROL ) 125 mg/2 mL injection 125 mg (125 mg Intravenous Given 08/16/23 1210)    ED Course/ Medical Decision Making/ A&P                                 Medical Decision Making Amount and/or Complexity of Data Reviewed Labs: ordered. Decision-making details documented in ED Course.    Details: Labs ordered reviewed and interpreted flu COVID and RSV are negative Radiology: ordered.    Details: Chest x-ray shows no acute disease chronic COPD ECG/medicine tests: ordered and independent interpretation performed. Decision-making details documented in ED Course.    Details: EKG normal sinus no acute changes  Risk Prescription drug management. Risk Details: Patient is given a DuoNeb and Solu-Medrol  125.  Patient reports she feels much better after DuoNeb.  Patient is sitting up eating and drinking and wants to go home.  I will give the patient a prescription for prednisone  50 mg for  the next 5 days she states she has an albuterol  inhaler and albuterol  neb solution at home.  Patient is advised to follow-up with her primary care physician for recheck.           Final Clinical Impression(s) / ED Diagnoses Final diagnoses:  COPD exacerbation (HCC)    Rx / DC Orders ED Discharge Orders          Ordered    predniSONE  (DELTASONE ) 50 MG tablet        08/16/23 1432          An After Visit Summary was printed and given to the patient.     Sandi Crosby, [place] 08/16/23 1432  Burnette Carte, MD 08/16/23 1550
